# Patient Record
Sex: Male | Born: 1995 | Race: Asian | Hispanic: No | Marital: Single | Smoking: Never smoker
Health system: Southern US, Community
[De-identification: ages and names within clinical notes are randomized; demographics above are authoritative.]

---
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
## 2017-12-23 ENCOUNTER — Ambulatory Visit (INDEPENDENT_AMBULATORY_CARE_PROVIDER_SITE_OTHER): Payer: BLUE CROSS/BLUE SHIELD | Admitting: Physician Assistant

## 2017-12-23 ENCOUNTER — Encounter: Payer: Self-pay | Admitting: Physician Assistant

## 2017-12-23 ENCOUNTER — Other Ambulatory Visit: Payer: Self-pay

## 2017-12-23 VITALS — BP 110/60 | HR 96 | Temp 98.3°F | Ht 70.47 in | Wt 177.6 lb

## 2017-12-23 DIAGNOSIS — J069 Acute upper respiratory infection, unspecified: Secondary | ICD-10-CM

## 2017-12-23 DIAGNOSIS — B9789 Other viral agents as the cause of diseases classified elsewhere: Secondary | ICD-10-CM | POA: Diagnosis not present

## 2017-12-23 MED ORDER — BENZONATATE 200 MG PO CAPS: 200.0000 mg | ORAL_CAPSULE | Freq: Two times a day (BID) | ORAL | 0 refills | Status: AC | PRN

## 2017-12-23 MED ORDER — NAPROXEN 500 MG PO TABS: 500.0000 mg | ORAL_TABLET | Freq: Two times a day (BID) | ORAL | 0 refills | Status: AC

## 2017-12-23 MED ORDER — CETIRIZINE-PSEUDOEPHEDRINE ER 5-120 MG PO TB12: 1.0000 | ORAL_TABLET | Freq: Two times a day (BID) | ORAL | 0 refills | Status: AC

## 2017-12-23 NOTE — Progress Notes (Signed)
    12/23/2017 3:39 PM   DOB: 05-02-1996 / MRN: [id number]  SUBJECTIVE:  Richard Ferrell is a 22 y.o. male presenting for cough, nasal congestion, sore throat.  Denies fever.  He is eating well. Taking OTC analgesics with some relief. Denies chest pain, SOB, new DOE, orthopnea, leg swelling.    He has No Known Allergies.   He  has no past medical history on file.    He  reports that he has never smoked. He has never used smokeless tobacco. He reports that he does not drink alcohol. He  has no sexual activity history on file. The patient  has no past surgical history on file.  His family history is not on file.  ROS  Per HPI  The problem list and medications were reviewed and updated by myself where necessary and exist elsewhere in the encounter.   OBJECTIVE:  BP 110/60 (BP Location: Left Arm, Patient Position: Sitting, Cuff Size: Normal)   Pulse 96   Temp 98.3 F (36.8 C) (Oral)   Ht 5' 10.47" (1.79 m)   Wt 177 lb 9.6 oz (80.6 kg)   SpO2 96%   BMI 25.14 kg/m   Physical Exam  Constitutional: He is oriented to person, place, and time. He appears well-developed. He does not appear ill.  Eyes: Pupils are equal, round, and reactive to light. Conjunctivae and EOM are normal.  Cardiovascular: Normal rate, regular rhythm, S1 normal, S2 normal, normal heart sounds, intact distal pulses and normal pulses. Exam reveals no gallop and no friction rub.  No murmur heard. Pulmonary/Chest: Effort normal. No stridor. No respiratory distress. He has no wheezes. He has no rales.  Abdominal: He exhibits no distension.  Musculoskeletal: Normal range of motion. He exhibits no edema.  Neurological: He is alert and oriented to person, place, and time. He has normal strength and normal reflexes. He is not disoriented. No cranial nerve deficit or sensory deficit. He exhibits normal muscle tone. Coordination and gait normal.  Skin: Skin is warm and dry. He is not diaphoretic.  Psychiatric: He has a normal  mood and affect. His behavior is normal.  Nursing note and vitals reviewed.   No results found for this or any previous visit (from the past 72 hour(s)).  No results found.  ASSESSMENT AND PLAN:  Richard Ferrell was seen today for cough and runny nose.  Diagnoses and all orders for this visit:  Viral URI with cough -     cetirizine-pseudoephedrine (ZYRTEC-D) 5-120 MG tablet; Take 1 tablet by mouth 2 (two) times daily for 7 days. -     naproxen (NAPROSYN) 500 MG tablet; Take 1 tablet (500 mg total) by mouth 2 (two) times daily with a meal. -     benzonatate (TESSALON) 200 MG capsule; Take 1 capsule (200 mg total) by mouth 2 (two) times daily as needed for cough.    The patient is advised to call or return to clinic if he does not see an improvement in symptoms, or to seek the care of the closest emergency department if he worsens with the above plan.   Deliah BostonMichael Shepherd Ferrell, MHS, PA-C [facility] 12/23/2017 3:39 PM

## 2017-12-23 NOTE — Patient Instructions (Addendum)
  Start the cold medications as I prescribed them.  I expect to be feeling better in the next 4 to 6 days and will need medicine by that time.   IF you received an x-ray today, you will receive an invoice from [facility]. Please contact [facility] at [phone] with questions or concerns regarding your invoice.   IF you received labwork today, you will receive an invoice from [facility]. Please contact [facility] at [phone] with questions or concerns regarding your invoice.   Our billing staff will not be able to assist you with questions regarding bills from these companies.  You will be contacted with the lab results as soon as they are available. The fastest way to get your results is to activate your My Chart account. Instructions are located on the last page of this paperwork. If you have not heard from us regarding the results in 2 weeks, please contact this office.
